# Patient Record
Sex: Female | Born: 1975 | Hispanic: No | Marital: Married | Smoking: Never smoker
Health system: Southern US, Community
[De-identification: ages and names within clinical notes are randomized; demographics above are authoritative.]

## PROBLEM LIST (undated history)

## (undated) DIAGNOSIS — D649 Anemia, unspecified: Secondary | ICD-10-CM

## (undated) DIAGNOSIS — Z90710 Acquired absence of both cervix and uterus: Secondary | ICD-10-CM

## (undated) HISTORY — PX: INGUINAL HERNIA REPAIR: SHX194

## (undated) HISTORY — PX: WISDOM TOOTH EXTRACTION: SHX21

---

## 2003-12-23 HISTORY — PX: UMBILICAL HERNIA REPAIR: SHX196

## 2018-09-24 NOTE — Patient Instructions (Signed)
Your procedure is scheduled on: Thursday, 10/24  Enter through the Main Entrance of [facility] at: 8 AM  Pick up the phone at the desk and dial [phone].  Call this number if you have problems the morning of surgery: [phone].  Remember: Do NOT eat food OR Do NOT drink clear liquids (including water) after midnight Wednesday.  Take these medicines the morning of surgery with a SIP OF WATER: None  Brush your teeth on the day of surgery.  Bring inhaler with you on day of surgery.  Do Not smoke on the day of surgery.  Stop herbal medications, vitamin supplements, Ibuprofen/NSAIDS at this time.  Do NOT wear jewelry (body piercing), metal hair clips/bobby pins, make-up, or nail polish. Do NOT wear lotions, powders, or perfumes.  You may wear deoderant. Do NOT shave for 48 hours prior to surgery. Do NOT bring valuables to the hospital. Contacts, dentures, or bridgework may not be worn into surgery.  Leave suitcase in car.  After surgery it may be brought to your room.  For patients admitted to the hospital, checkout time is 11:00 AM the day of discharge. Have a responsible adult drive you home and stay with you for 24 hours after your procedure.

## 2018-10-04 ENCOUNTER — Inpatient Hospital Stay (HOSPITAL_COMMUNITY)
Admission: RE | Admit: 2018-10-04 | Discharge: 2018-10-04 | Disposition: A | Discharge status: Home / Self Care | Payer: Self-pay | Source: Ambulatory Visit

## 2018-10-08 NOTE — Patient Instructions (Signed)
Your procedure is scheduled on: Thursday, 10/24 at 9:20 am  Enter through the Main Entrance of [facility] at:  7:45 am  Pick up the phone at the desk and dial [phone].  Call this number if you have problems the morning of surgery: [phone].  Remember: Do NOT eat food or Do NOT drink clear liquids (including water) after midnight Wednesday  Take these medicines the morning of surgery with a SIP OF WATER:   Brush your teeth on the day of surgery.  Bring inhaler with you on day of surgery.  Do Not smoke on the day of surgery.  Stop herbal medications, vitamin supplements, Ibuprofen/NSAIDS at this time.  Do NOT wear jewelry (body piercing), metal hair clips/bobby pins, make-up, or nail polish. Do NOT wear lotions, powders, or perfumes.  You may wear deoderant. Do NOT shave for 48 hours prior to surgery. Do NOT bring valuables to the hospital. Contacts, dentures, or bridgework may not be worn into surgery.  Leave suitcase in car.  After surgery it may be brought to your room.  For patients admitted to the hospital, checkout time is 11:00 AM the day of discharge.

## 2018-10-13 ENCOUNTER — Inpatient Hospital Stay (HOSPITAL_COMMUNITY): Admission: RE | Admit: 2018-10-13 | Payer: Managed Care, Other (non HMO) | Source: Ambulatory Visit

## 2018-10-13 ENCOUNTER — Encounter (HOSPITAL_COMMUNITY)
Admission: RE | Admit: 2018-10-13 | Discharge: 2018-10-13 | Disposition: A | Discharge status: Home / Self Care | Payer: Managed Care, Other (non HMO) | Source: Ambulatory Visit | Attending: Obstetrics and Gynecology | Admitting: Obstetrics and Gynecology

## 2018-10-13 ENCOUNTER — Other Ambulatory Visit: Payer: Self-pay

## 2018-10-13 ENCOUNTER — Encounter (HOSPITAL_COMMUNITY): Payer: Self-pay

## 2018-10-13 ENCOUNTER — Inpatient Hospital Stay (HOSPITAL_COMMUNITY)
Admission: RE | Admit: 2018-10-13 | Discharge: 2018-10-13 | Disposition: A | Discharge status: Home / Self Care | Payer: Managed Care, Other (non HMO) | Source: Ambulatory Visit

## 2018-10-13 HISTORY — DX: Anemia, unspecified: D64.9

## 2018-10-13 LAB — TYPE AND SCREEN
ABO/RH(D): O POS
ANTIBODY SCREEN: NEGATIVE

## 2018-10-13 LAB — CBC
HCT: 36.4 % (ref 36.0–46.0)
Hemoglobin: 12 g/dL (ref 12.0–15.0)
MCH: 28.6 pg (ref 26.0–34.0)
MCHC: 33 g/dL (ref 30.0–36.0)
MCV: 86.7 fL (ref 80.0–100.0)
Platelets: 415 10*3/uL — ABNORMAL HIGH (ref 150–400)
RBC: 4.2 MIL/uL (ref 3.87–5.11)
RDW: 14.1 % (ref 11.5–15.5)
WBC: 10.8 10*3/uL — AB (ref 4.0–10.5)
nRBC: 0 % (ref 0.0–0.2)

## 2018-10-13 LAB — ABO/RH: ABO/RH(D): O POS

## 2018-10-13 NOTE — Patient Instructions (Signed)
Your procedure is scheduled on: Thursday October 14, 2018 at 9:20 am  Enter through the Main Entrance of [facility] at: 7:45 am  Pick up the phone at the desk and dial [phone].  Call this number if you have problems the morning of surgery: [phone].  Remember: Do NOT eat food or drink any liquids after: Midnight on Wednesday October 23  Take these medicines the morning of surgery with a SIP OF WATER: none  STOP ALL HERBAL MEDICATIONS, SUPPLEMENTS, VITAMINS NOW  DO NOT SMOKE DAY OF SURGERY  BRUSH YOUR TEETH DAY OF SURGERY  Do NOT wear jewelry (body piercing), metal hair clips/bobby pins, make-up, or nail polish. Do NOT wear lotions, powders, or perfumes.  You may wear deoderant. Do NOT shave for 48 hours prior to surgery. Do NOT bring valuables to the hospital. Contacts, dentures, or bridgework may not be worn into surgery. Leave suitcase in car.  After surgery it may be brought to your room.    For patients admitted to the hospital, checkout time is 11:00 AM the day of discharge.

## 2018-10-13 NOTE — H&P (Signed)
Charlene Perkins is a 42yo [id number] female with menorrhagia, pelvic pain and dysmenorrhea over past year - worsening. She used to have 4-5 day cycles but now lasting 7-8 days, associated with cramping and passage of clots. She has to change tampons q 3 hours. Pt has had a BTL. She has completed family status. Pt noted to have an anterior fibroid.  She had a negative emb in august and was offered ablation vs hysterectomy for semi and definitive options. Risks and benefits of both procedures were reviewed including post op expectations. Pt opts for total laparoscopic hysterectomy with bilateral salpingectomy and cystoscopy; possible open.    Pertinent Gynecological History: Menses: flow is excessive with use of 8 pads or tampons on heaviest days Bleeding: menorrhagia Contraception: tubal ligation DES exposure: denies Blood transfusions: none Sexually transmitted diseases: no past history Previous GYN Procedures: btl  Last mammogram: normal Date: 10/2017 Last pap: normal Date: 11/2015 OB History: G5, P3023   Menstrual History: Menarche age: 58 No LMP recorded.    Past Medical History:  Diagnosis Date  . Anemia     Past Surgical History:  Procedure Laterality Date  . INGUINAL HERNIA REPAIR     AGE 59  . UMBILICAL HERNIA REPAIR  2005  . WISDOM TOOTH EXTRACTION      No family history on file.  Social History:  reports that she has never smoked. She has never used smokeless tobacco. She reports that she does not use drugs. Her alcohol history is not on file.  Allergies: No Known Allergies  No medications prior to admission.    Review of Systems  Constitutional: Positive for malaise/fatigue. Negative for chills, fever and weight loss.  Eyes: Negative for blurred vision and double vision.  Respiratory: Negative for shortness of breath.   Cardiovascular: Negative for chest pain and leg swelling.  Gastrointestinal: Positive for abdominal pain. Negative for heartburn, nausea and vomiting.   Genitourinary: Positive for urgency. Negative for dysuria.  Musculoskeletal: Positive for back pain.  Skin: Negative for itching and rash.  Neurological: Positive for dizziness. Negative for headaches.  Endo/Heme/Allergies: Does not bruise/bleed easily.  Psychiatric/Behavioral: Negative for depression, hallucinations, substance abuse and suicidal ideas. The patient is nervous/anxious.     There were no vitals taken for this visit. Physical Exam  Constitutional: She is oriented to person, place, and time. She appears well-developed and well-nourished.  Neck: Normal range of motion.  Cardiovascular: Normal rate.  Respiratory: Effort normal.  GI: Soft.  Genitourinary: Vagina normal and uterus normal.  Musculoskeletal: Normal range of motion.  Neurological: She is alert and oriented to person, place, and time.  Skin: Skin is warm.  Psychiatric: She has a normal mood and affect. Her behavior is normal. Judgment and thought content normal.    Results for orders placed or performed during the hospital encounter of 10/13/18 (from the past 24 hour(s))  CBC     Status: Abnormal   Collection Time: 10/13/18 11:40 AM  Result Value Ref Range   WBC 10.8 (H) 4.0 - 10.5 K/uL   RBC 4.20 3.87 - 5.11 MIL/uL   Hemoglobin 12.0 12.0 - 15.0 g/dL   HCT 36.4 36.0 - 46.0 %   MCV 86.7 80.0 - 100.0 fL   MCH 28.6 26.0 - 34.0 pg   MCHC 33.0 30.0 - 36.0 g/dL   RDW 14.1 11.5 - 15.5 %   Platelets 415 (H) 150 - 400 K/uL   nRBC 0.0 0.0 - 0.2 %  Type and screen Blanco  Dana     Status: None   Collection Time: 10/13/18 11:40 AM  Result Value Ref Range   ABO/RH(D) O POS    Antibody Screen NEG    Sample Expiration 10/27/2018    Extend sample reason      NO TRANSFUSIONS OR PREGNANCY IN THE PAST 3 MONTHS Performed at [facility], [address] [place], [place] [address]     No results found.  Assessment/Plan: 74AD [id number] female with menorrhagia, pelvic pain and dysmenorrhea and  fibroid opting for definative treatment via  total laparoscopic hysterectomy with bilateral salpingectomy and cystoscopy; possible open All questions answered and consent verified To OR when ready Prophylactic antibiotics to be given  Isaiah Serge 10/13/2018, 5:33 PM

## 2018-10-14 ENCOUNTER — Inpatient Hospital Stay (HOSPITAL_COMMUNITY): Payer: Managed Care, Other (non HMO) | Admitting: Anesthesiology

## 2018-10-14 ENCOUNTER — Inpatient Hospital Stay (HOSPITAL_COMMUNITY)
Admission: AD | Admit: 2018-10-14 | Discharge: 2018-10-15 | DRG: 743 | Disposition: A | Discharge status: Home / Self Care | Payer: Managed Care, Other (non HMO) | Attending: Obstetrics and Gynecology | Admitting: Obstetrics and Gynecology

## 2018-10-14 ENCOUNTER — Encounter (HOSPITAL_COMMUNITY)
Admission: AD | Disposition: A | Discharge status: Home / Self Care | Payer: Self-pay | Source: Home / Self Care | Attending: Obstetrics and Gynecology

## 2018-10-14 ENCOUNTER — Encounter (HOSPITAL_COMMUNITY): Payer: Self-pay

## 2018-10-14 ENCOUNTER — Other Ambulatory Visit: Payer: Self-pay

## 2018-10-14 DIAGNOSIS — Z9889 Other specified postprocedural states: Secondary | ICD-10-CM

## 2018-10-14 DIAGNOSIS — N946 Dysmenorrhea, unspecified: Secondary | ICD-10-CM | POA: Diagnosis present

## 2018-10-14 DIAGNOSIS — N945 Secondary dysmenorrhea: Secondary | ICD-10-CM | POA: Diagnosis present

## 2018-10-14 DIAGNOSIS — Z90710 Acquired absence of both cervix and uterus: Secondary | ICD-10-CM

## 2018-10-14 DIAGNOSIS — D259 Leiomyoma of uterus, unspecified: Secondary | ICD-10-CM | POA: Diagnosis present

## 2018-10-14 DIAGNOSIS — R102 Pelvic and perineal pain: Secondary | ICD-10-CM | POA: Diagnosis present

## 2018-10-14 DIAGNOSIS — N920 Excessive and frequent menstruation with regular cycle: Principal | ICD-10-CM | POA: Diagnosis present

## 2018-10-14 HISTORY — PX: TOTAL LAPAROSCOPIC HYSTERECTOMY WITH SALPINGECTOMY: SHX6742

## 2018-10-14 HISTORY — DX: Acquired absence of both cervix and uterus: Z90.710

## 2018-10-14 HISTORY — PX: CYSTOSCOPY: SHX5120

## 2018-10-14 LAB — PREGNANCY, URINE: PREG TEST UR: NEGATIVE

## 2018-10-14 SURGERY — HYSTERECTOMY, TOTAL, LAPAROSCOPIC, WITH SALPINGECTOMY
Anesthesia: General | Site: Bladder

## 2018-10-14 MED ORDER — PROPOFOL 10 MG/ML IV BOLUS
INTRAVENOUS | Status: AC
  Filled 2018-10-14: qty 20

## 2018-10-14 MED ORDER — OXYCODONE-ACETAMINOPHEN 5-325 MG PO TABS
1.0000 | ORAL_TABLET | ORAL | Status: DC | PRN
  Administered 2018-10-15: 1 via ORAL
  Filled 2018-10-14: qty 1

## 2018-10-14 MED ORDER — SUGAMMADEX SODIUM 200 MG/2ML IV SOLN
INTRAVENOUS | Status: DC | PRN
  Administered 2018-10-14: 222.2 mg via INTRAVENOUS

## 2018-10-14 MED ORDER — SODIUM CHLORIDE 0.9 % IR SOLN
Status: DC | PRN
  Administered 2018-10-14: 3000 mL

## 2018-10-14 MED ORDER — ROCURONIUM BROMIDE 100 MG/10ML IV SOLN
INTRAVENOUS | Status: AC
  Filled 2018-10-14: qty 1

## 2018-10-14 MED ORDER — LIDOCAINE HCL (CARDIAC) PF 100 MG/5ML IV SOSY
PREFILLED_SYRINGE | INTRAVENOUS | Status: AC
  Filled 2018-10-14: qty 5

## 2018-10-14 MED ORDER — LACTATED RINGERS IV SOLN
INTRAVENOUS | Status: DC
  Administered 2018-10-14: 125 mL/h via INTRAVENOUS
  Administered 2018-10-14 (×2): via INTRAVENOUS

## 2018-10-14 MED ORDER — KETOROLAC TROMETHAMINE 30 MG/ML IJ SOLN
INTRAMUSCULAR | Status: AC
  Filled 2018-10-14: qty 1

## 2018-10-14 MED ORDER — ROCURONIUM BROMIDE 100 MG/10ML IV SOLN
INTRAVENOUS | Status: DC | PRN
  Administered 2018-10-14: 10 mg via INTRAVENOUS
  Administered 2018-10-14: 50 mg via INTRAVENOUS
  Administered 2018-10-14: 5 mg via INTRAVENOUS

## 2018-10-14 MED ORDER — LIDOCAINE HCL (CARDIAC) PF 100 MG/5ML IV SOSY
PREFILLED_SYRINGE | INTRAVENOUS | Status: DC | PRN
  Administered 2018-10-14: 70 mg via INTRAVENOUS

## 2018-10-14 MED ORDER — MENTHOL 3 MG MT LOZG: 1.0000 | LOZENGE | OROMUCOSAL | Status: DC | PRN

## 2018-10-14 MED ORDER — BUPIVACAINE HCL (PF) 0.25 % IJ SOLN
INTRAMUSCULAR | Status: AC
  Filled 2018-10-14: qty 30

## 2018-10-14 MED ORDER — OXYCODONE-ACETAMINOPHEN 5-325 MG PO TABS
1.0000 | ORAL_TABLET | ORAL | Status: DC | PRN
  Administered 2018-10-14: 1 via ORAL
  Filled 2018-10-14: qty 1

## 2018-10-14 MED ORDER — FLUORESCEIN SODIUM 10 % IV SOLN
INTRAVENOUS | Status: AC
  Filled 2018-10-14: qty 5

## 2018-10-14 MED ORDER — ONDANSETRON HCL 4 MG PO TABS: 4.0000 mg | ORAL_TABLET | Freq: Four times a day (QID) | ORAL | Status: DC | PRN

## 2018-10-14 MED ORDER — DEXAMETHASONE SODIUM PHOSPHATE 4 MG/ML IJ SOLN
INTRAMUSCULAR | Status: AC
  Filled 2018-10-14: qty 1

## 2018-10-14 MED ORDER — SIMETHICONE 80 MG PO CHEW
80.0000 mg | CHEWABLE_TABLET | Freq: Four times a day (QID) | ORAL | Status: DC | PRN
  Administered 2018-10-15: 80 mg via ORAL
  Filled 2018-10-14: qty 1

## 2018-10-14 MED ORDER — IBUPROFEN 600 MG PO TABS
600.0000 mg | ORAL_TABLET | Freq: Four times a day (QID) | ORAL | Status: DC | PRN
  Administered 2018-10-14 – 2018-10-15 (×3): 600 mg via ORAL
  Filled 2018-10-14 (×4): qty 1

## 2018-10-14 MED ORDER — ONDANSETRON HCL 4 MG/2ML IJ SOLN
INTRAMUSCULAR | Status: AC
  Filled 2018-10-14: qty 2

## 2018-10-14 MED ORDER — FENTANYL CITRATE (PF) 250 MCG/5ML IJ SOLN
INTRAMUSCULAR | Status: AC
  Filled 2018-10-14: qty 5

## 2018-10-14 MED ORDER — GLYCOPYRROLATE 0.2 MG/ML IJ SOLN
INTRAMUSCULAR | Status: DC | PRN
  Administered 2018-10-14: 0.2 mg via INTRAVENOUS

## 2018-10-14 MED ORDER — DEXAMETHASONE SODIUM PHOSPHATE 10 MG/ML IJ SOLN
INTRAMUSCULAR | Status: DC | PRN
  Administered 2018-10-14: 10 mg via INTRAVENOUS

## 2018-10-14 MED ORDER — SCOPOLAMINE 1 MG/3DAYS TD PT72
MEDICATED_PATCH | TRANSDERMAL | Status: AC
  Filled 2018-10-14: qty 1

## 2018-10-14 MED ORDER — FLUORESCEIN SODIUM 10 % IV SOLN
INTRAVENOUS | Status: DC | PRN
  Administered 2018-10-14: 25 mg via INTRAVENOUS

## 2018-10-14 MED ORDER — ONDANSETRON HCL 4 MG/2ML IJ SOLN
INTRAMUSCULAR | Status: DC | PRN
  Administered 2018-10-14: 4 mg via INTRAVENOUS

## 2018-10-14 MED ORDER — BUPIVACAINE HCL (PF) 0.25 % IJ SOLN
INTRAMUSCULAR | Status: DC | PRN
  Administered 2018-10-14: 14 mL

## 2018-10-14 MED ORDER — SENNA 8.6 MG PO TABS
1.0000 | ORAL_TABLET | Freq: Two times a day (BID) | ORAL | Status: DC
  Administered 2018-10-14 – 2018-10-15 (×2): 8.6 mg via ORAL
  Filled 2018-10-14 (×5): qty 1

## 2018-10-14 MED ORDER — ONDANSETRON HCL 4 MG/2ML IJ SOLN
4.0000 mg | Freq: Four times a day (QID) | INTRAMUSCULAR | Status: DC | PRN
  Administered 2018-10-14: 4 mg via INTRAVENOUS
  Filled 2018-10-14: qty 2

## 2018-10-14 MED ORDER — FENTANYL CITRATE (PF) 100 MCG/2ML IJ SOLN
INTRAMUSCULAR | Status: DC | PRN
  Administered 2018-10-14: 100 ug via INTRAVENOUS
  Administered 2018-10-14 (×2): 50 ug via INTRAVENOUS
  Administered 2018-10-14: 100 ug via INTRAVENOUS
  Administered 2018-10-14 (×2): 50 ug via INTRAVENOUS
  Administered 2018-10-14: 100 ug via INTRAVENOUS

## 2018-10-14 MED ORDER — SCOPOLAMINE 1 MG/3DAYS TD PT72
1.0000 | MEDICATED_PATCH | Freq: Once | TRANSDERMAL | Status: DC
  Administered 2018-10-14: 1.5 mg via TRANSDERMAL

## 2018-10-14 MED ORDER — PANTOPRAZOLE SODIUM 40 MG PO TBEC
40.0000 mg | DELAYED_RELEASE_TABLET | Freq: Every day | ORAL | Status: DC
  Administered 2018-10-15: 40 mg via ORAL
  Filled 2018-10-14: qty 1

## 2018-10-14 MED ORDER — SUGAMMADEX SODIUM 200 MG/2ML IV SOLN
INTRAVENOUS | Status: AC
  Filled 2018-10-14: qty 4

## 2018-10-14 MED ORDER — DEXTROSE 5 % IV SOLN
3.0000 g | INTRAVENOUS | Status: DC
  Filled 2018-10-14: qty 3000

## 2018-10-14 MED ORDER — MIDAZOLAM HCL 2 MG/2ML IJ SOLN
INTRAMUSCULAR | Status: AC
  Filled 2018-10-14: qty 2

## 2018-10-14 MED ORDER — FENTANYL CITRATE (PF) 100 MCG/2ML IJ SOLN
INTRAMUSCULAR | Status: AC
  Administered 2018-10-14: 50 ug via INTRAVENOUS
  Filled 2018-10-14: qty 2

## 2018-10-14 MED ORDER — GLYCOPYRROLATE 0.2 MG/ML IJ SOLN
INTRAMUSCULAR | Status: AC
  Filled 2018-10-14: qty 3

## 2018-10-14 MED ORDER — PROPOFOL 10 MG/ML IV BOLUS
INTRAVENOUS | Status: DC | PRN
  Administered 2018-10-14: 150 mg via INTRAVENOUS

## 2018-10-14 MED ORDER — STERILE WATER FOR IRRIGATION IR SOLN
Status: DC | PRN
  Administered 2018-10-14: 1000 mL

## 2018-10-14 MED ORDER — LACTATED RINGERS IV SOLN: INTRAVENOUS | Status: DC

## 2018-10-14 MED ORDER — ALUM & MAG HYDROXIDE-SIMETH 200-200-20 MG/5ML PO SUSP: 30.0000 mL | ORAL | Status: DC | PRN

## 2018-10-14 MED ORDER — FENTANYL CITRATE (PF) 100 MCG/2ML IJ SOLN
25.0000 ug | INTRAMUSCULAR | Status: DC | PRN
  Administered 2018-10-14 (×2): 50 ug via INTRAVENOUS

## 2018-10-14 MED ORDER — CEFAZOLIN SODIUM-DEXTROSE 2-4 GM/100ML-% IV SOLN
INTRAVENOUS | Status: AC
  Filled 2018-10-14: qty 100

## 2018-10-14 MED ORDER — KETOROLAC TROMETHAMINE 30 MG/ML IJ SOLN
INTRAMUSCULAR | Status: DC | PRN
  Administered 2018-10-14: 30 mg via INTRAVENOUS

## 2018-10-14 MED ORDER — CEFAZOLIN SODIUM-DEXTROSE 2-3 GM-%(50ML) IV SOLR
INTRAVENOUS | Status: DC | PRN
  Administered 2018-10-14: 2 g via INTRAVENOUS

## 2018-10-14 MED ORDER — MIDAZOLAM HCL 2 MG/2ML IJ SOLN
INTRAMUSCULAR | Status: DC | PRN
  Administered 2018-10-14: 2 mg via INTRAVENOUS

## 2018-10-14 SURGICAL SUPPLY — 50 items
BARRIER ADHS 3X4 INTERCEED (GAUZE/BANDAGES/DRESSINGS) IMPLANT
CABLE HIGH FREQUENCY MONO STRZ (ELECTRODE) IMPLANT
COVER BACK TABLE 60X90IN (DRAPES) ×4 IMPLANT
COVER LIGHT HANDLE  1/PK (MISCELLANEOUS) ×4
COVER LIGHT HANDLE 1/PK (MISCELLANEOUS) ×4 IMPLANT
COVER MAYO STAND STRL (DRAPES) ×4 IMPLANT
DERMABOND ADVANCED (GAUZE/BANDAGES/DRESSINGS) ×2
DERMABOND ADVANCED .7 DNX12 (GAUZE/BANDAGES/DRESSINGS) ×2 IMPLANT
DEVICE SUTURE ENDOST 10MM (ENDOMECHANICALS) ×8 IMPLANT
DURAPREP 26ML APPLICATOR (WOUND CARE) ×4 IMPLANT
ELECT REM PT RETURN 9FT ADLT (ELECTROSURGICAL) ×4
ELECTRODE REM PT RTRN 9FT ADLT (ELECTROSURGICAL) ×2 IMPLANT
FILTER SMOKE EVAC LAPAROSHD (FILTER) ×4 IMPLANT
GLOVE BIO SURGEON STRL SZ 6.5 (GLOVE) ×6 IMPLANT
GLOVE BIO SURGEONS STRL SZ 6.5 (GLOVE) ×2
GLOVE BIOGEL PI IND STRL 7.0 (GLOVE) ×6 IMPLANT
GLOVE BIOGEL PI INDICATOR 7.0 (GLOVE) ×6
GOWN STRL REUS W/TWL LRG LVL3 (GOWN DISPOSABLE) ×12 IMPLANT
MANIPULATOR UTERINE 7CM CLEARV (MISCELLANEOUS) IMPLANT
NS IRRIG 1000ML POUR BTL (IV SOLUTION) ×4 IMPLANT
OCCLUDER COLPOPNEUMO (BALLOONS) ×4 IMPLANT
PACK LAVH (CUSTOM PROCEDURE TRAY) ×4 IMPLANT
PACK TRENDGUARD 600 HYBRD PROC (MISCELLANEOUS) ×2 IMPLANT
PORT ACCESS TROCAR AIRSEAL 12 (TROCAR) ×2 IMPLANT
PORT ACCESS TROCAR AIRSEAL 5M (TROCAR) ×2
PROTECTOR NERVE ULNAR (MISCELLANEOUS) ×8 IMPLANT
SCISSORS LAP 5X35 DISP (ENDOMECHANICALS) IMPLANT
SET CYSTO W/LG BORE CLAMP LF (SET/KITS/TRAYS/PACK) ×4 IMPLANT
SET IRRIG TUBING LAPAROSCOPIC (IRRIGATION / IRRIGATOR) ×4 IMPLANT
SET TRI-LUMEN FLTR TB AIRSEAL (TUBING) ×4 IMPLANT
SHEARS HARMONIC ACE PLUS 36CM (ENDOMECHANICALS) ×4 IMPLANT
SLEEVE XCEL OPT CAN 5 100 (ENDOMECHANICALS) ×4 IMPLANT
SUT ENDO VLOC 180-0-8IN (SUTURE) ×8 IMPLANT
SUT VIC AB 0 CT1 27 (SUTURE) ×4
SUT VIC AB 0 CT1 27XBRD ANBCTR (SUTURE) ×4 IMPLANT
SUT VICRYL 0 UR6 27IN ABS (SUTURE) ×4 IMPLANT
SUT VICRYL 4-0 PS2 18IN ABS (SUTURE) ×4 IMPLANT
SYR 10ML LL (SYRINGE) ×4 IMPLANT
SYR 50ML LL SCALE MARK (SYRINGE) ×4 IMPLANT
SYSTEM CARTER THOMASON II (TROCAR) ×4 IMPLANT
TIP UTERINE 6.7X8CM BLUE DISP (MISCELLANEOUS) ×4 IMPLANT
TOWEL OR 17X24 6PK STRL BLUE (TOWEL DISPOSABLE) ×8 IMPLANT
TRAY FOLEY W/BAG SLVR 14FR (SET/KITS/TRAYS/PACK) ×4 IMPLANT
TRENDGUARD 600 HYBRID PROC PK (MISCELLANEOUS) ×4
TROCAR PORT AIRSEAL 5X120 (TROCAR) IMPLANT
TROCAR PORT AIRSEAL 8X120 (TROCAR) IMPLANT
TROCAR XCEL NON-BLD 11X100MML (ENDOMECHANICALS) ×4 IMPLANT
TROCAR XCEL NON-BLD 5MMX100MML (ENDOMECHANICALS) ×4 IMPLANT
TUBING INSUF HEATED (TUBING) ×4 IMPLANT
WARMER LAPAROSCOPE (MISCELLANEOUS) ×4 IMPLANT

## 2018-10-14 NOTE — Anesthesia Procedure Notes (Addendum)
Procedure Name: Intubation Date/Time: 10/14/2018 9:29 AM Performed by: Rayvon Char, CRNA Pre-anesthesia Checklist: Patient identified, Emergency Drugs available and Suction available Patient Re-evaluated:Patient Re-evaluated prior to induction Oxygen Delivery Method: Circle system utilized Preoxygenation: Pre-oxygenation with 100% oxygen Induction Type: IV induction Ventilation: Mask ventilation without difficulty Laryngoscope Size: Miller and 2 Grade View: Grade II Tube size: 7.0 mm Number of attempts: 1 Airway Equipment and Method: Stylet

## 2018-10-14 NOTE — Progress Notes (Signed)
Patient ID: Charlene Perkins, female   DOB: 1976-08-13, 42 y.o.   MRN: [id number] POD#0  Pt doing well. Some pain - mild. No fever or chills. Got lightheaded going to bathroom. Better now. Pain well controlled  VSS  Plan: routine post op care Pain meds now

## 2018-10-14 NOTE — Brief Op Note (Signed)
10/14/2018  12:10 PM  PATIENT:  Charlene Perkins  42 y.o. female  PRE-OPERATIVE DIAGNOSIS:  menorrhagia, dysmenorrhea, uterine leiomyoma  POST-OPERATIVE DIAGNOSIS:  menorrhagia, dysmenorrhea, uterine leiomyoma  PROCEDURE:  Procedure(s) with comments: TOTAL LAPAROSCOPIC HYSTERECTOMY WITH SALPINGECTOMY (Bilateral) - poss open CYSTOSCOPY (N/A)  SURGEON:  Surgeon(s) and Role:    * Jontez Redfield, Bonnee Quin, DO - Primary    * Meisinger, Todd, MD - Assisting  PHYSICIAN ASSISTANT:   ASSISTANTS: Dr Willis Modena   ANESTHESIA:   general  EBL:  300 mL   BLOOD ADMINISTERED:none  DRAINS: none   LOCAL MEDICATIONS USED:  MARCAINE     SPECIMEN:  Source of Specimen:  uterus, cervix and bilateral tubes  DISPOSITION OF SPECIMEN:  PATHOLOGY  COUNTS:  YES  TOURNIQUET:  * No tourniquets in log *  DICTATION: .Note written in EPIC  PLAN OF CARE: Admit for overnight observation  PATIENT DISPOSITION:  PACU - hemodynamically stable.   Delay start of Pharmacological VTE agent (>24hrs) due to surgical blood loss or risk of bleeding: yes

## 2018-10-14 NOTE — Anesthesia Postprocedure Evaluation (Signed)
Anesthesia Post Note  Patient: Tessy Pawelski  Procedure(s) Performed: TOTAL LAPAROSCOPIC HYSTERECTOMY WITH SALPINGECTOMY (Bilateral Abdomen) CYSTOSCOPY (N/A Bladder)     Patient location during evaluation: Mother Baby Anesthesia Type: General Level of consciousness: awake and alert and oriented Pain management: satisfactory to patient Vital Signs Assessment: post-procedure vital signs reviewed and stable Respiratory status: respiratory function stable and spontaneous breathing Cardiovascular status: blood pressure returned to baseline Postop Assessment: no headache, no backache, spinal receding, patient able to bend at knees and adequate PO intake Anesthetic complications: no    Last Vitals:  Vitals:   10/14/18 1345 10/14/18 1409  BP: 112/71 (!) 108/59  Pulse: 89 68  Resp: 13 17  Temp: (!) 36.4 C 36.8 C  SpO2: 91% 100%    Last Pain:  Vitals:   10/14/18 1415  TempSrc:   PainSc: 3    Pain Goal: Patients Stated Pain Goal: 3 (10/14/18 1415)               Aayushi Solorzano

## 2018-10-14 NOTE — Anesthesia Postprocedure Evaluation (Signed)
Anesthesia Post Note  Patient: Charlene Perkins  Procedure(s) Performed: TOTAL LAPAROSCOPIC HYSTERECTOMY WITH SALPINGECTOMY (Bilateral Abdomen) CYSTOSCOPY (N/A Bladder)     Patient location during evaluation: PACU Anesthesia Type: General Level of consciousness: awake and alert Pain management: pain level controlled Vital Signs Assessment: post-procedure vital signs reviewed and stable Respiratory status: spontaneous breathing, nonlabored ventilation, respiratory function stable and patient connected to nasal cannula oxygen Cardiovascular status: blood pressure returned to baseline and stable Postop Assessment: no apparent nausea or vomiting Anesthetic complications: no    Last Vitals:  Vitals:   10/14/18 1345 10/14/18 1409  BP: 112/71 (!) 108/59  Pulse: 89 68  Resp: 13 17  Temp: (!) 36.4 C 36.8 C  SpO2: 91% 100%    Last Pain:  Vitals:   10/14/18 1409  TempSrc: Oral  PainSc:    Pain Goal: Patients Stated Pain Goal: 3 (10/14/18 0818)               Axtyn Woehler L Christoph Copelan

## 2018-10-14 NOTE — Addendum Note (Signed)
Addendum  created 10/14/18 1438 by Flossie Dibble, CRNA   Sign clinical note

## 2018-10-14 NOTE — Transfer of Care (Signed)
Immediate Anesthesia Transfer of Care Note  Patient: Charlene Perkins  Procedure(s) Performed: TOTAL LAPAROSCOPIC HYSTERECTOMY WITH SALPINGECTOMY (Bilateral Abdomen) CYSTOSCOPY (N/A Bladder)  Patient Location: PACU  Anesthesia Type:General  Level of Consciousness: awake, alert  and oriented  Airway & Oxygen Therapy: Patient Spontanous Breathing and Patient connected to nasal cannula oxygen  Post-op Assessment: Report given to RN and Post -op Vital signs reviewed and stable  Post vital signs: Reviewed and stable  Last Vitals:  Vitals Value Taken Time  BP 104/63 10/14/2018 12:21 PM  Temp    Pulse 86 10/14/2018 12:22 PM  Resp 16 10/14/2018 12:22 PM  SpO2 100 % 10/14/2018 12:22 PM  Vitals shown include unvalidated device data.  Last Pain:  Vitals:   10/14/18 0818  TempSrc: Oral  PainSc: 2       Patients Stated Pain Goal: 3 (55/21/74 7159)  Complications: No apparent anesthesia complications

## 2018-10-14 NOTE — Interval H&P Note (Signed)
History and Physical Interval Note: No changes Procedure re-reviewed and consent verified To OR when ready  10/14/2018 9:14 AM  Charlene Perkins  has presented today for surgery, with the diagnosis of menorrhagia, dysmenorrhea, uterine leiomyoma  The various methods of treatment have been discussed with the patient and family. After consideration of risks, benefits and other options for treatment, the patient has consented to  Procedure(s) with comments: TOTAL LAPAROSCOPIC HYSTERECTOMY WITH SALPINGECTOMY (Bilateral) - poss open CYSTOSCOPY (N/A) as a surgical intervention .  The patient's history has been reviewed, patient examined, no change in status, stable for surgery.  I have reviewed the patient's chart and labs.  Questions were answered to the patient's satisfaction.     Isaiah Serge

## 2018-10-14 NOTE — Anesthesia Preprocedure Evaluation (Addendum)
Anesthesia Evaluation  Patient identified by MRN, date of birth, ID band Patient awake    Reviewed: Allergy & Precautions, NPO status , Patient's Chart, lab work & pertinent test results  Airway Mallampati: I  TM Distance: >3 FB Neck ROM: Full    Dental no notable dental hx. (+) Teeth Intact, Dental Advisory Given   Pulmonary neg pulmonary ROS,    Pulmonary exam normal breath sounds clear to auscultation       Cardiovascular negative cardio ROS Normal cardiovascular exam Rhythm:Regular Rate:Normal     Neuro/Psych negative neurological ROS  negative psych ROS   GI/Hepatic negative GI ROS, Neg liver ROS,   Endo/Other  negative endocrine ROS  Renal/GU negative Renal ROS  negative genitourinary   Musculoskeletal negative musculoskeletal ROS (+)   Abdominal   Peds  Hematology  (+) anemia ,   Anesthesia Other Findings Uterine fibroids, menorrhagia  Reproductive/Obstetrics                            Anesthesia Physical Anesthesia Plan  ASA: II  Anesthesia Plan: General   Post-op Pain Management:    Induction: Intravenous  PONV Risk Score and Plan: 3 and Dexamethasone, Ondansetron, Scopolamine patch - Pre-op and Midazolam  Airway Management Planned: Oral ETT  Additional Equipment:   Intra-op Plan:   Post-operative Plan: Extubation in OR  Informed Consent: I have reviewed the patients History and Physical, chart, labs and discussed the procedure including the risks, benefits and alternatives for the proposed anesthesia with the patient or authorized representative who has indicated his/her understanding and acceptance.   Dental advisory given  Plan Discussed with: CRNA  Anesthesia Plan Comments:         Anesthesia Quick Evaluation

## 2018-10-15 ENCOUNTER — Encounter (HOSPITAL_COMMUNITY): Payer: Self-pay | Admitting: Obstetrics and Gynecology

## 2018-10-15 DIAGNOSIS — D259 Leiomyoma of uterus, unspecified: Secondary | ICD-10-CM | POA: Diagnosis present

## 2018-10-15 DIAGNOSIS — N946 Dysmenorrhea, unspecified: Secondary | ICD-10-CM | POA: Diagnosis present

## 2018-10-15 DIAGNOSIS — R102 Pelvic and perineal pain: Secondary | ICD-10-CM | POA: Diagnosis present

## 2018-10-15 DIAGNOSIS — N920 Excessive and frequent menstruation with regular cycle: Secondary | ICD-10-CM | POA: Diagnosis present

## 2018-10-15 LAB — CBC
HEMATOCRIT: 29.2 % — AB (ref 36.0–46.0)
HEMOGLOBIN: 9.9 g/dL — AB (ref 12.0–15.0)
MCH: 28.8 pg (ref 26.0–34.0)
MCHC: 33.9 g/dL (ref 30.0–36.0)
MCV: 84.9 fL (ref 80.0–100.0)
PLATELETS: 345 10*3/uL (ref 150–400)
RBC: 3.44 MIL/uL — ABNORMAL LOW (ref 3.87–5.11)
RDW: 14.2 % (ref 11.5–15.5)
WBC: 15.7 10*3/uL — ABNORMAL HIGH (ref 4.0–10.5)
nRBC: 0 % (ref 0.0–0.2)

## 2018-10-15 MED ORDER — IBUPROFEN 600 MG PO TABS: 600.0000 mg | ORAL_TABLET | Freq: Four times a day (QID) | ORAL | 1 refills | Status: DC | PRN

## 2018-10-15 MED ORDER — SIMETHICONE 80 MG PO CHEW: 80.0000 mg | CHEWABLE_TABLET | Freq: Four times a day (QID) | ORAL | 0 refills | Status: DC | PRN

## 2018-10-15 MED ORDER — OXYCODONE-ACETAMINOPHEN 5-325 MG PO TABS: 1.0000 | ORAL_TABLET | Freq: Four times a day (QID) | ORAL | 0 refills | Status: AC | PRN

## 2018-10-15 NOTE — Op Note (Signed)
Operative Note    Preoperative Diagnosis: menorrhgia, dysmenorrhea and uterine leiomyoma   Postoperative Diagnosis: same   Procedure: Total laparoscopic hysterectomy with bilateral salpingectomy  Surgeon: Mickle Mallory DO Assist: Meisinger T MD  Anesthesia: General  Fluids: LR 2L EBL: 312ml UOP: 67ml   Findings: boggy uterus measuring 10cm, normal bilateral tubes showing previous ligation; normal ovaries   Specimen: uterus, bilateral fallopian tube remnants, cervix   Procedure Note Pt seen in pre-op. Procedure reviewed and all questions answered; consent verified  Pt taken to operating room and placed in dorsal lithotomy position with her arms safely positioned at her sides. General anesthesia was administered and found to be adequate. Pt was prepped and draped in sterile fashion and a timeout performed. A weighted speculum was placed in the posterior fornix and a sim retractor placed anteriorly.Great visualization of cervix noted; high. Uterus was sounded to 10cm so a size 8 koh was assembled with a small cup and placed with retention sutures at 3 and 9 o'clock. A foley catheter was also placed in a sterile fashion Legs were then lowered and attention turned to her abdomen.  Here a 48mm incision was made at the umbilicus. A 74mm optiview trocar was then placed with the abdomen tented upwards. The laparoscopic camera was used to confirm placement and pneumoperitoneum obtained with CO2 gas to 28mmHg. The patient was placed in trendelenberg and gross survey of pelvis done.The uterus was noted as described above:  At this time one more 84mm port was then placed under direct visualization in right lower quadrant and an 12 site in the left with care taken to avoid the epigastric vessels.  Further exploration noted   Starting on the the patients left, the left fallopian tube remnant was then grasped with a blunt grasper, elevated and excised using the harmonic hemostatically. Next the  utero-ovarian ligament and the round ligaments were sequentially grasped and excised. The broad ligament was then separated from the uterus as well with a bladder flap created. Several bleeding vessels were effective coagulated. The cardinal ligament was then excised next at the utero-cervical junction and the ovarian vessel clamped, cauterized and cut. Again bleeding vessels were cauterized effectively. The same was done on the patients right with similar results.  Next the bladder reflection was dissected away. The vaginal occluder had been filled with 60cc of saline and starting anteriorly and working laterally the uterus and cervix were amputated off the superior vagina. The uterus, cervix and fallopian tubes were removed vaginally.  The pelvis was irrigated and hemostasis noted. The angles of the vaginal cuff were easily seen and using the endostitch with an 0 vicryl v-lock suture, the cuff was closed in a running fashion with 3-4 back stitches to ensure closure.  Further irrigation of the pelvis confirmed no abnormalities or bleeding hence patient was flattened.  The 12 port site was closed with 0-vicryl suture with a carter thomasen to ensure closure of the peritoneum.  The remaining  trocars were removed under direct visualization and some gas allowed to escape.  Incision sites were closed with 4-0 vicryl suture and dermabond.   A cystoscopy was performed after pt given flourescein. Jets were noted from both ureteral orifices. Counts were noted to be correct. Patient was awakened and taken to recovery room in stable status.  Foley was left in place

## 2018-10-15 NOTE — Discharge Instructions (Signed)
Nothing in vagina and no heavy straining for 6 weeks.  No sex, tampons, and douching.  Other instructions as in [employer].

## 2018-10-15 NOTE — Discharge Summary (Signed)
Physician Discharge Summary  Patient ID: Charlene Perkins MRN: [id number] DOB/AGE: [birth year] 42 y.o.  Admit date: 10/14/2018 Discharge date: 10/15/2018  Admission Diagnoses:  Discharge Diagnoses:  Active Problems:   Secondary dysmenorrhea   S/P laparoscopic hysterectomy   Post-operative state   Discharged Condition: stable  Hospital Course: Pt admitted overnight for extended recovery. Did well. Stable to dsicharge to home today  Consults: None  Significant Diagnostic Studies: labs: wnl  Treatments: IV hydration and analgesia: ibuprofen and oxycodone  Discharge Exam: Blood pressure 128/81, pulse 68, temperature 98.7 F (37.1 C), temperature source Oral, resp. rate 18, height 5\' 4"  (1.626 m), weight 111.1 kg, SpO2 99 %. General appearance: alert, cooperative and no distress Extremities: Homans sign is negative, no sign of DVT Incision/Wound:well approx  Disposition: Discharge disposition: 01-Home or Self Care       Discharge Instructions    Call MD for:  persistant nausea and vomiting   Complete by:  As directed    Call MD for:  redness, tenderness, or signs of infection (pain, swelling, redness, odor or green/yellow discharge around incision site)   Complete by:  As directed    Call MD for:  severe uncontrolled pain   Complete by:  As directed    Call MD for:  temperature >100.4   Complete by:  As directed    Diet - low sodium heart healthy   Complete by:  As directed    Discharge instructions   Complete by:  As directed    No heavy straining and nothing in vagina x 6 weeks   Increase activity slowly   Complete by:  As directed      Allergies as of 10/15/2018   No Known Allergies     Medication List    TAKE these medications   ibuprofen 600 MG tablet Commonly known as:  ADVIL,MOTRIN Take 1 tablet (600 mg total) by mouth every 6 (six) hours as needed for moderate pain or cramping (mild pain).   oxyCODONE-acetaminophen 5-325 MG tablet Commonly known as:   PERCOCET/ROXICET Take 1-2 tablets by mouth every 6 (six) hours as needed for up to 7 days for moderate pain ((when tolerating fluids)).   simethicone 80 MG chewable tablet Commonly known as:  MYLICON Chew 1 tablet (80 mg total) by mouth every 6 (six) hours as needed for flatulence.      Follow-up Information    Sherlyn Hay, DO. Schedule an appointment as soon as possible for a visit in 2 week(s).   Specialty:  Obstetrics and Gynecology Why:  For incision check and 6 weeks for post op visit Contact information: [address] [place] [address] [phone]           Signed: Isaiah Serge 10/15/2018, 12:54 PM

## 2018-10-15 NOTE — Progress Notes (Signed)
Patient ID: Charlene Perkins, female   DOB: [birth year], 42 y.o.   MRN: [id number] Pt feeling better. Gas pain improving. Passing flatus. Feels ready for discharge to home VSS ABD - soft EXT - no homans  15.7>9.9<345  A/P: POD#1 s//p TLH/BS - stable         Discharge to home          F/u in office in 2 weeks for incision check

## 2018-10-15 NOTE — Brief Op Note (Signed)
10/14/2018  12:56 PM  PATIENT:  Charlene Perkins  42 y.o. female  PRE-OPERATIVE DIAGNOSIS:  menorrhagia, dysmenorrhea, uterine leiomyoma  POST-OPERATIVE DIAGNOSIS:  menorrhagia, dysmenorrhea, uterine leiomyoma  PROCEDURE:  Procedure(s) with comments: TOTAL LAPAROSCOPIC HYSTERECTOMY WITH SALPINGECTOMY (Bilateral) - poss open CYSTOSCOPY (N/A)  SURGEON:  Surgeon(s) and Role:    * Banga, Bonnee Quin, DO - Primary    * Meisinger, Todd, MD - Assisting    ANESTHESIA:   general  EBL:  300 mL   BLOOD ADMINISTERED:none  DRAINS: none   LOCAL MEDICATIONS USED:  MARCAINE     SPECIMEN:  Source of Specimen:  uterus, tubes and cervix  DISPOSITION OF SPECIMEN:  PATHOLOGY  COUNTS:  YES  TOURNIQUET:  * No tourniquets in log *  DICTATION: .Note written in EPIC  PLAN OF CARE: Admit for overnight observation  PATIENT DISPOSITION:  PACU - hemodynamically stable.   Delay start of Pharmacological VTE agent (>24hrs) due to surgical blood loss or risk of bleeding: yes

## 2018-10-15 NOTE — Progress Notes (Signed)
1 Day Post-Op Procedure(s) (LRB): TOTAL LAPAROSCOPIC HYSTERECTOMY WITH SALPINGECTOMY (Bilateral) CYSTOSCOPY (N/A)  Subjective: Patient reports tolerating PO, + flatus and no problems voiding. Incision pain tolerable with ibuprofen but has left shoulder pain. She ia ambulating well  Objective: I have reviewed patient's vital signs, intake and output, medications and labs.  General: alert, cooperative and no distress Extremities: edema none  Assessment: s/p Procedure(s) with comments: TOTAL LAPAROSCOPIC HYSTERECTOMY WITH SALPINGECTOMY (Bilateral) - poss open CYSTOSCOPY (N/A): stable  Plan: Encourage ambulation gas relief - medication to be given now  Discharge to home this afternoon   LOS: 1 day    [place]s 10/15/2018, 8:59 AM

## 2019-04-22 ENCOUNTER — Other Ambulatory Visit: Payer: Self-pay

## 2019-04-22 DIAGNOSIS — R0602 Shortness of breath: Secondary | ICD-10-CM | POA: Diagnosis present

## 2019-04-22 NOTE — ED Triage Notes (Addendum)
Pt from home state slight SOB late this afternoon went home was ok but once in bed felt as thought she was gasping for air and her throat was progressively tightening Currently no distress noted, no stridor, lungs essentially clear all fields. Pt states it feel like she has a lot of phlegm but cough is non-productive.

## 2019-04-23 ENCOUNTER — Emergency Department (HOSPITAL_COMMUNITY)
Admission: EM | Admit: 2019-04-23 | Discharge: 2019-04-23 | Disposition: A | Discharge status: Home / Self Care | Payer: Managed Care, Other (non HMO) | Attending: Emergency Medicine | Admitting: Emergency Medicine

## 2019-04-23 ENCOUNTER — Emergency Department (HOSPITAL_COMMUNITY): Payer: Managed Care, Other (non HMO)

## 2019-04-23 DIAGNOSIS — R0602 Shortness of breath: Secondary | ICD-10-CM

## 2019-04-23 MED ORDER — DEXAMETHASONE 4 MG PO TABS
10.0000 mg | ORAL_TABLET | Freq: Once | ORAL | Status: AC
  Administered 2019-04-23: 10 mg via ORAL
  Filled 2019-04-23: qty 2

## 2019-04-23 MED ORDER — LIDOCAINE HCL (PF) 1 % IJ SOLN: 20.0000 mL | Freq: Once | INTRAMUSCULAR | Status: DC

## 2019-04-23 NOTE — ED Provider Notes (Signed)
[facility] DEPT Provider Note   CSN: [id number] Arrival date & time: 04/22/19  2254    History   Chief Complaint Chief Complaint  Patient presents with  . Shortness of Breath  . Throat tightening    HPI Charlene Perkins is a 43 y.o. female.   The history is provided by the patient.  She has history of anemia and comes in with shortness of breath and sounds like there is mucus in her throat.  She started having some mild scratchy throat about 2 days ago and has had an intermittent mild cough which has been nonproductive.  She states it felt like there might be something in her throat but it seemed to get worse today.  Today, she noted that her heart was beating fast.  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  She denies fever, chills, sweats.  She denies arthralgias or myalgias.  She denies nausea, vomiting, diarrhea.  She denies any sick contacts and specifically denies exposure to anyone with known COVID-19.  There is been no recent travel or contact with people with recent travel.  Past Medical History:  Diagnosis Date  . Anemia   . S/P laparoscopic hysterectomy 10/14/2018    Patient Active Problem List   Diagnosis Date Noted  . 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 S/P laparoscopic hysterectomy 10/14/2018  . Post-operative state 10/14/2018    Past Surgical History:  Procedure Laterality Date  . CYSTOSCOPY N/A 10/14/2018   Procedure: CYSTOSCOPY;  Surgeon: Sherlyn Hay, DO;  Location: [facility];  Service: Gynecology;  Laterality: N/A;  . INGUINAL HERNIA REPAIR     AGE 15  . TOTAL LAPAROSCOPIC HYSTERECTOMY WITH SALPINGECTOMY Bilateral 10/14/2018   Procedure: TOTAL LAPAROSCOPIC HYSTERECTOMY WITH SALPINGECTOMY;  Surgeon: Sherlyn Hay, DO;  Location: [facility];  Service: Gynecology;  Laterality: Bilateral;  poss open  . UMBILICAL HERNIA REPAIR  2005  . WISDOM TOOTH  EXTRACTION       OB History   No obstetric history on file.      Home Medications    Prior to Admission medications   Medication Sig Start Date End Date Taking? Authorizing Provider  ibuprofen (ADVIL,MOTRIN) 600 MG tablet Take 1 tablet (600 mg total) by mouth every 6 (six) hours as needed for moderate pain or cramping (mild pain). 10/15/18   Sherlyn Hay, DO  simethicone (MYLICON) 80 MG chewable tablet Chew 1 tablet (80 mg total) by mouth every 6 (six) hours as needed for flatulence. 10/15/18   Sherlyn Hay, DO    Family History No family history on file.  Social History Social History   Tobacco Use  . Smoking status: Never Smoker  . Smokeless tobacco: Never Used  Substance Use Topics  . Alcohol use: Not on file    Comment: SOCIALLY I PER YEAR  . Drug use: Never     Allergies   Patient has no known allergies.   Review of Systems Review of Systems  All other systems reviewed and are negative.    Physical Exam Updated Vital Signs BP (!) 143/97 (BP Location: Left Arm)   Pulse (!) 115   Temp 98.7 F (37.1 C) (Oral)   Resp 18   SpO2 100%   Physical Exam Vitals signs and nursing note reviewed.    43 year old female, resting comfortably and in no acute distress. Vital signs are significant for mildly elevated blood pressure and  elevated heart rate. Oxygen saturation is 100%, which is normal. Head is normocephalic and atraumatic. PERRLA, EOMI. Oropharynx is clear.  There is minimal edema of the uvula.  There is no stridor and there is no pooling of secretions.  Phonation is normal. Neck is nontender and supple without adenopathy or JVD. Back is nontender and there is no CVA tenderness. Lungs are clear without rales, wheezes, or rhonchi. Chest is nontender. Heart has regular rate and rhythm without murmur. Abdomen is soft, flat, nontender without masses or hepatosplenomegaly and peristalsis is normoactive. Extremities have no cyanosis or edema,  full range of motion is present. Skin is warm and dry without rash. Neurologic: Mental status is normal, cranial nerves are intact, there are no motor or sensory deficits.  ED Treatments / Results   Radiology Dg Neck Soft Tissue  Result Date: 04/23/2019 CLINICAL DATA:  Shortness of breath EXAM: NECK SOFT TISSUES - 1+ VIEW COMPARISON:  None. FINDINGS: There is no evidence of retropharyngeal soft tissue swelling or epiglottic enlargement. The cervical airway is unremarkable and no radio-opaque foreign body identified. IMPRESSION: Negative. Electronically Signed   By: Rolm Baptise M.D.   On: 04/23/2019 01:19   Dg Chest 2 View  Result Date: 04/23/2019 CLINICAL DATA:  Shortness of breath EXAM: CHEST - 2 VIEW COMPARISON:  None. FINDINGS: Heart and mediastinal contours are within normal limits. No focal opacities or effusions. No acute bony abnormality. IMPRESSION: No active cardiopulmonary disease. Electronically Signed   By: Rolm Baptise M.D.   On: 04/23/2019 01:19    Procedures Procedures   Medications Ordered in ED Medications  dexamethasone (DECADRON) tablet 10 mg (10 mg Oral Given 04/23/19 0135)     Initial Impression / Assessment and Plan / ED Course  I have reviewed the triage vital signs and the nursing notes.  Pertinent imaging results that were available during my care of the patient were reviewed by me and considered in my medical decision making (see chart for details).  Sense of mucus accumulating in her throat which may be related to the edema of her uvula.  She is afebrile and with normal oxygen saturation.  Low index of suspicion for COVID-19.  Will check chest x-ray and soft tissue neck x-ray.  Old records are reviewed, and she has no relevant past visits.  Chest x-ray and soft tissue neck x-rays are unremarkable.  Patient is advised of these findings.  She is given a single dose of dexamethasone which should help with any edema in the pharynx.  Advised to return if symptoms  are worsening.  Final Clinical Impressions(s) / ED Diagnoses   Final diagnoses:  SOB (shortness of breath)    ED Discharge Orders    None       Delora Fuel, MD 36/64/40 [phone]

## 2019-04-23 NOTE — Discharge Instructions (Addendum)
Drink plenty of fluids.  Return if symptoms are getting worse. 

## 2019-10-07 ENCOUNTER — Emergency Department (HOSPITAL_COMMUNITY)
Admission: EM | Admit: 2019-10-07 | Discharge: 2019-10-08 | Disposition: A | Discharge status: Home / Self Care | Payer: Managed Care, Other (non HMO) | Attending: Emergency Medicine | Admitting: Emergency Medicine

## 2019-10-07 ENCOUNTER — Encounter (HOSPITAL_COMMUNITY): Payer: Self-pay | Admitting: Emergency Medicine

## 2019-10-07 ENCOUNTER — Other Ambulatory Visit: Payer: Self-pay

## 2019-10-07 DIAGNOSIS — U071 COVID-19: Secondary | ICD-10-CM | POA: Diagnosis not present

## 2019-10-07 DIAGNOSIS — R079 Chest pain, unspecified: Secondary | ICD-10-CM

## 2019-10-07 MED ORDER — SODIUM CHLORIDE 0.9% FLUSH
3.0000 mL | Freq: Once | INTRAVENOUS | Status: AC
  Administered 2019-10-08: 3 mL via INTRAVENOUS

## 2019-10-08 ENCOUNTER — Other Ambulatory Visit: Payer: Self-pay

## 2019-10-08 ENCOUNTER — Emergency Department (HOSPITAL_COMMUNITY): Payer: Managed Care, Other (non HMO)

## 2019-10-08 LAB — CBC
HCT: 39 % (ref 36.0–46.0)
Hemoglobin: 12.6 g/dL (ref 12.0–15.0)
MCH: 28.6 pg (ref 26.0–34.0)
MCHC: 32.3 g/dL (ref 30.0–36.0)
MCV: 88.6 fL (ref 80.0–100.0)
Platelets: 376 10*3/uL (ref 150–400)
RBC: 4.4 MIL/uL (ref 3.87–5.11)
RDW: 13.3 % (ref 11.5–15.5)
WBC: 14.7 10*3/uL — ABNORMAL HIGH (ref 4.0–10.5)
nRBC: 0 % (ref 0.0–0.2)

## 2019-10-08 LAB — BASIC METABOLIC PANEL
Anion gap: 9 (ref 5–15)
BUN: 18 mg/dL (ref 6–20)
CO2: 24 mmol/L (ref 22–32)
Calcium: 9.4 mg/dL (ref 8.9–10.3)
Chloride: 105 mmol/L (ref 98–111)
Creatinine, Ser: 0.74 mg/dL (ref 0.44–1.00)
GFR calc Af Amer: >60 mL/min (ref >60)
GFR calc non Af Amer: >60 mL/min (ref >60)
Glucose, Bld: 115 mg/dL — ABNORMAL HIGH (ref 70–99)
Potassium: 4.1 mmol/L (ref 3.5–5.1)
Sodium: 138 mmol/L (ref 135–145)

## 2019-10-08 LAB — TROPONIN I (HIGH SENSITIVITY)
Troponin I (High Sensitivity): <2 ng/L (ref <18)
Troponin I (High Sensitivity): <2 ng/L (ref <18)

## 2019-10-08 LAB — I-STAT BETA HCG BLOOD, ED (MC, WL, AP ONLY): I-stat hCG, quantitative: <5 m[IU]/mL (ref <5)

## 2019-10-08 MED ORDER — LACTATED RINGERS IV BOLUS
1000.0000 mL | Freq: Once | INTRAVENOUS | Status: AC
  Administered 2019-10-08: 1000 mL via INTRAVENOUS

## 2019-10-08 MED ORDER — LORAZEPAM 0.5 MG PO TABS
0.5000 mg | ORAL_TABLET | Freq: Once | ORAL | Status: AC
  Administered 2019-10-08: 0.5 mg via ORAL
  Filled 2019-10-08: qty 1

## 2019-10-08 MED ORDER — ALBUTEROL SULFATE HFA 108 (90 BASE) MCG/ACT IN AERS
2.0000 | INHALATION_SPRAY | Freq: Once | RESPIRATORY_TRACT | Status: AC
  Administered 2019-10-08: 2 via RESPIRATORY_TRACT
  Filled 2019-10-08: qty 6.7

## 2019-10-08 NOTE — ED Provider Notes (Signed)
Emergency Department Provider Note   I have reviewed the triage vital signs and the nursing notes.   HISTORY  Chief Complaint COVID +   HPI Charlene Perkins is a 43 y.o. female who presents emerged from today with some chest tightness.  Patient states she is diagnosed with coronavirus approximately 8 days ago after having symptoms for about 3 days.  She states that all of her symptoms improved but she has this persistent feeling of restricted breathing in her chest.  She feels if she has to cough to get something up that has not seemed to help.  She does not have any shortness of breath.  She does not have any cough otherwise.  She is any fever.  She has no other associated symptoms.  No lower extremity swelling.   No other associated or modifying symptoms.    Past Medical History:  Diagnosis Date  . Anemia   . S/P laparoscopic hysterectomy 10/14/2018    Patient Active Problem List   Diagnosis Date Noted  . 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 S/P laparoscopic hysterectomy 10/14/2018  . Post-operative state 10/14/2018    Past Surgical History:  Procedure Laterality Date  . CYSTOSCOPY N/A 10/14/2018   Procedure: CYSTOSCOPY;  Surgeon: Sherlyn Hay, DO;  Location: [facility];  Service: Gynecology;  Laterality: N/A;  . INGUINAL HERNIA REPAIR     AGE 45  . TOTAL LAPAROSCOPIC HYSTERECTOMY WITH SALPINGECTOMY Bilateral 10/14/2018   Procedure: TOTAL LAPAROSCOPIC HYSTERECTOMY WITH SALPINGECTOMY;  Surgeon: Sherlyn Hay, DO;  Location: [facility];  Service: Gynecology;  Laterality: Bilateral;  poss open  . UMBILICAL HERNIA REPAIR  2005  . WISDOM TOOTH EXTRACTION      Current Outpatient Rx  . Order #: [id number] Class: Historical Med  . Order #: [id number] Class: Historical Med  . Order #: [id number] Class: Historical Med    Allergies Patient has no known allergies.  History reviewed. No pertinent family history.  Social History Social History   Tobacco Use  . Smoking  status: Never Smoker  . Smokeless tobacco: Never Used  Substance Use Topics  . Alcohol use: Not on file    Comment: SOCIALLY I PER YEAR  . Drug use: Never    Review of Systems  All other systems negative except as documented in the HPI. All pertinent positives and negatives as reviewed in the HPI. ____________________________________________   PHYSICAL EXAM:  VITAL SIGNS: ED Triage Vitals  Enc Vitals Group     BP 10/07/19 2345 (!) 147/92     Pulse Rate 10/07/19 2345 (!) 129     Resp 10/07/19 2345 (!) 22     Temp 10/07/19 2345 98.9 F (37.2 C)     Temp Source 10/07/19 2345 Oral     SpO2 10/07/19 2345 100 %     Weight --      Height --      Head Circumference --      Peak Flow --      Pain Score 10/08/19 0026 0     Pain Loc --      Pain Edu? --      Excl. in [place]? --     Constitutional: Alert and oriented. Well appearing and in no acute distress. Eyes: Conjunctivae are normal. PERRL. EOMI. Head: Atraumatic. Nose: No congestion/rhinnorhea. Mouth/Throat: Mucous membranes are moist.  Oropharynx non-erythematous. Neck: No stridor.  No meningeal signs.   Cardiovascular: tachycardic rate, regular rhythm. Good peripheral circulation. Grossly normal heart sounds.   Respiratory: Normal respiratory effort.  No retractions. Lungs with mild diffuse wheezing. Gastrointestinal: Soft and nontender. No distention.  Musculoskeletal: No lower extremity tenderness nor edema. No gross deformities of extremities. Neurologic:  Normal speech and language. No gross focal neurologic deficits are appreciated.  Skin:  Skin is warm, dry and intact. No rash noted.  ____________________________________________   LABS (all labs ordered are listed, but only abnormal results are displayed)  Labs Reviewed  BASIC METABOLIC PANEL - Abnormal; Notable for the following components:      Result Value   Glucose, Bld 115 (*)    All other components within normal limits  CBC - Abnormal; Notable for the  following components:   WBC 14.7 (*)    All other components within normal limits  I-STAT BETA HCG BLOOD, ED ([facility], [facility], [facility] ONLY)  TROPONIN I (HIGH SENSITIVITY)  TROPONIN I (HIGH SENSITIVITY)   ____________________________________________  EKG   EKG Interpretation  Date/Time:  Friday October 07 2019 23:48:15 EDT Ventricular Rate:  112 PR Interval:    QRS Duration: 78 QT Interval:  320 QTC Calculation: 437 R Axis:   61 Text Interpretation:  Sinus tachycardia Ventricular premature complex Aberrant complex Low voltage, precordial leads Baseline wander in lead(s) V6 No old tracing to compare Confirmed by Merrily Pew [phone]) on 10/08/2019 1:36:19 AM       ____________________________________________  RADIOLOGY  Dg Chest Port 1 View  Result Date: 10/08/2019 CLINICAL DATA:  Shortness of breath and chest pain EXAM: PORTABLE CHEST 1 VIEW COMPARISON:  None. FINDINGS: The heart size and mediastinal contours are within normal limits. Both lungs are clear. The visualized skeletal structures are unremarkable. IMPRESSION: No acute cardiopulmonary process. Electronically Signed   By: Prudencio Pair M.D.   On: 10/08/2019 00:25    ____________________________________________   INITIAL IMPRESSION / ASSESSMENT AND PLAN / ED COURSE  Likely post covid bronchoconstriction. Workup without evidence of ACS. Low suspicion for PE even with tachycardia as it improved with fluids/ativan to below 100 without hypoxia or tachypnea. Will try inhalerto see if it helps.   Improved symptoms with albuterol. Improved HR. Plan for dc w/ strict return precautions.   Pertinent labs & imaging results that were available during my care of the patient were reviewed by me and considered in my medical decision making (see chart for details).  A medical screening exam was performed and I feel the patient has had an appropriate workup for their chief complaint at this time and likelihood of emergent condition existing is  low. They have been counseled on decision, discharge, follow up and which symptoms necessitate immediate return to the emergency department. They or their family verbally stated understanding and agreement with plan and discharged in stable condition.   ____________________________________________  FINAL CLINICAL IMPRESSION(S) / ED DIAGNOSES  Final diagnoses:  Chest pain     MEDICATIONS GIVEN DURING THIS VISIT:  Medications  albuterol (VENTOLIN HFA) 108 (90 Base) MCG/ACT inhaler 2 puff (has no administration in time range)  sodium chloride flush (NS) 0.9 % injection 3 mL (3 mLs Intravenous Given 10/08/19 0004)  LORazepam (ATIVAN) tablet 0.5 mg (0.5 mg Oral Given 10/08/19 0012)  lactated ringers bolus 1,000 mL (1,000 mLs Intravenous New Bag/Given 10/08/19 0013)     NEW OUTPATIENT MEDICATIONS STARTED DURING THIS VISIT:  New Prescriptions   No medications on file    Note:  This note was prepared with assistance of Dragon voice recognition software. Occasional wrong-word or sound-a-like substitutions may have occurred due to the inherent limitations of voice recognition  software.   Clent Damore, Corene Cornea, MD 10/08/19 [phone]

## 2019-10-08 NOTE — ED Notes (Signed)
Pt was verbalized discharge instructions. Pt had no further questions at this time. NAD. 

## 2019-10-08 NOTE — ED Triage Notes (Signed)
Pt is coming from home with a chief complaint of a blockage in her throat. Pt is A&O x 4 and ambulatory. Pt denies any SOB and is in no obvious distress at this time. Pt was tested for COVID on October 8 and had a positive test result.    SPO2 100%

## 2020-05-27 IMAGING — CR CHEST - 2 VIEW
2 series · 2 of 2 positions shown · non-contrast
Comparison: None.

CLINICAL DATA: Shortness of breath

EXAM:
CHEST - 2 VIEW

[w chest lat]
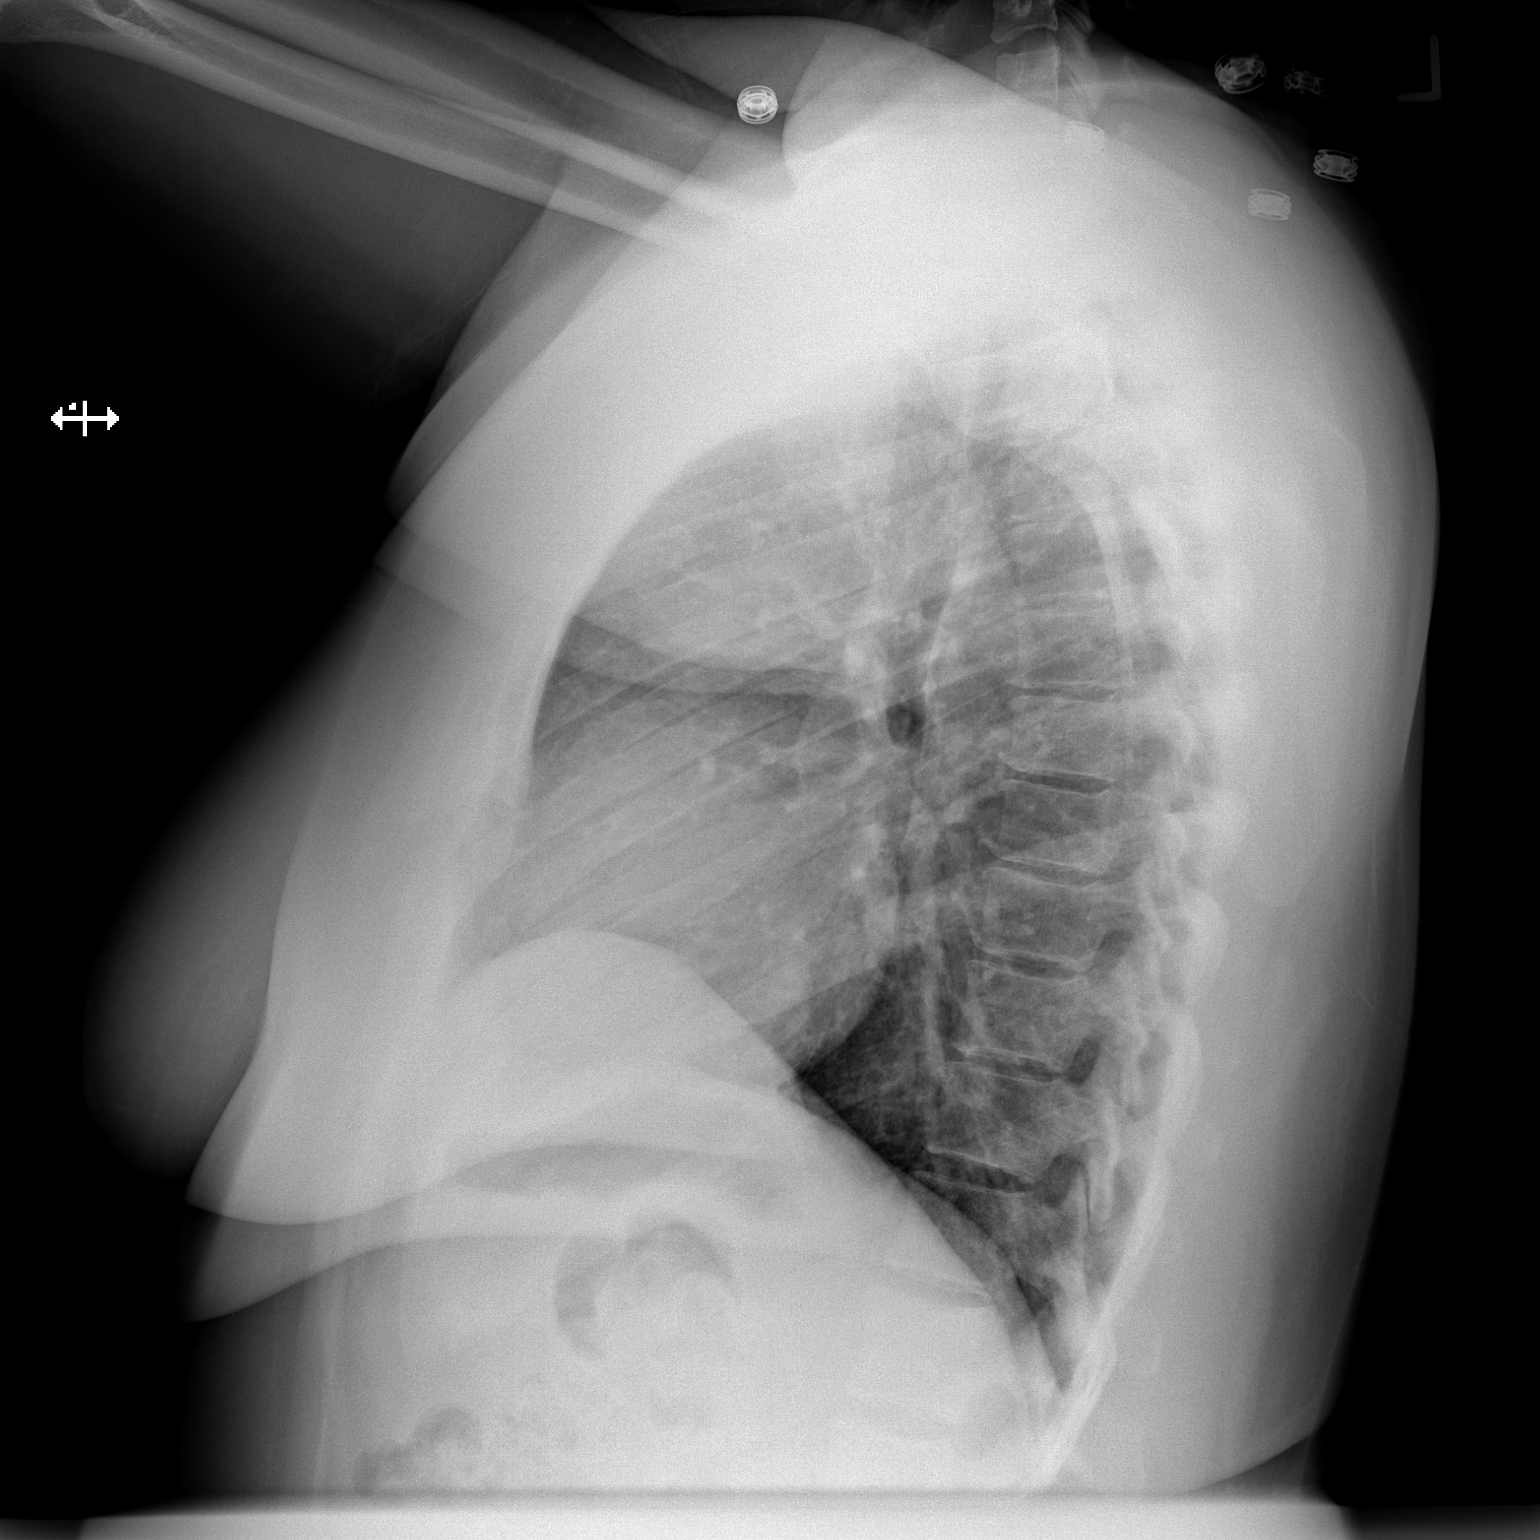

[w chest pa]
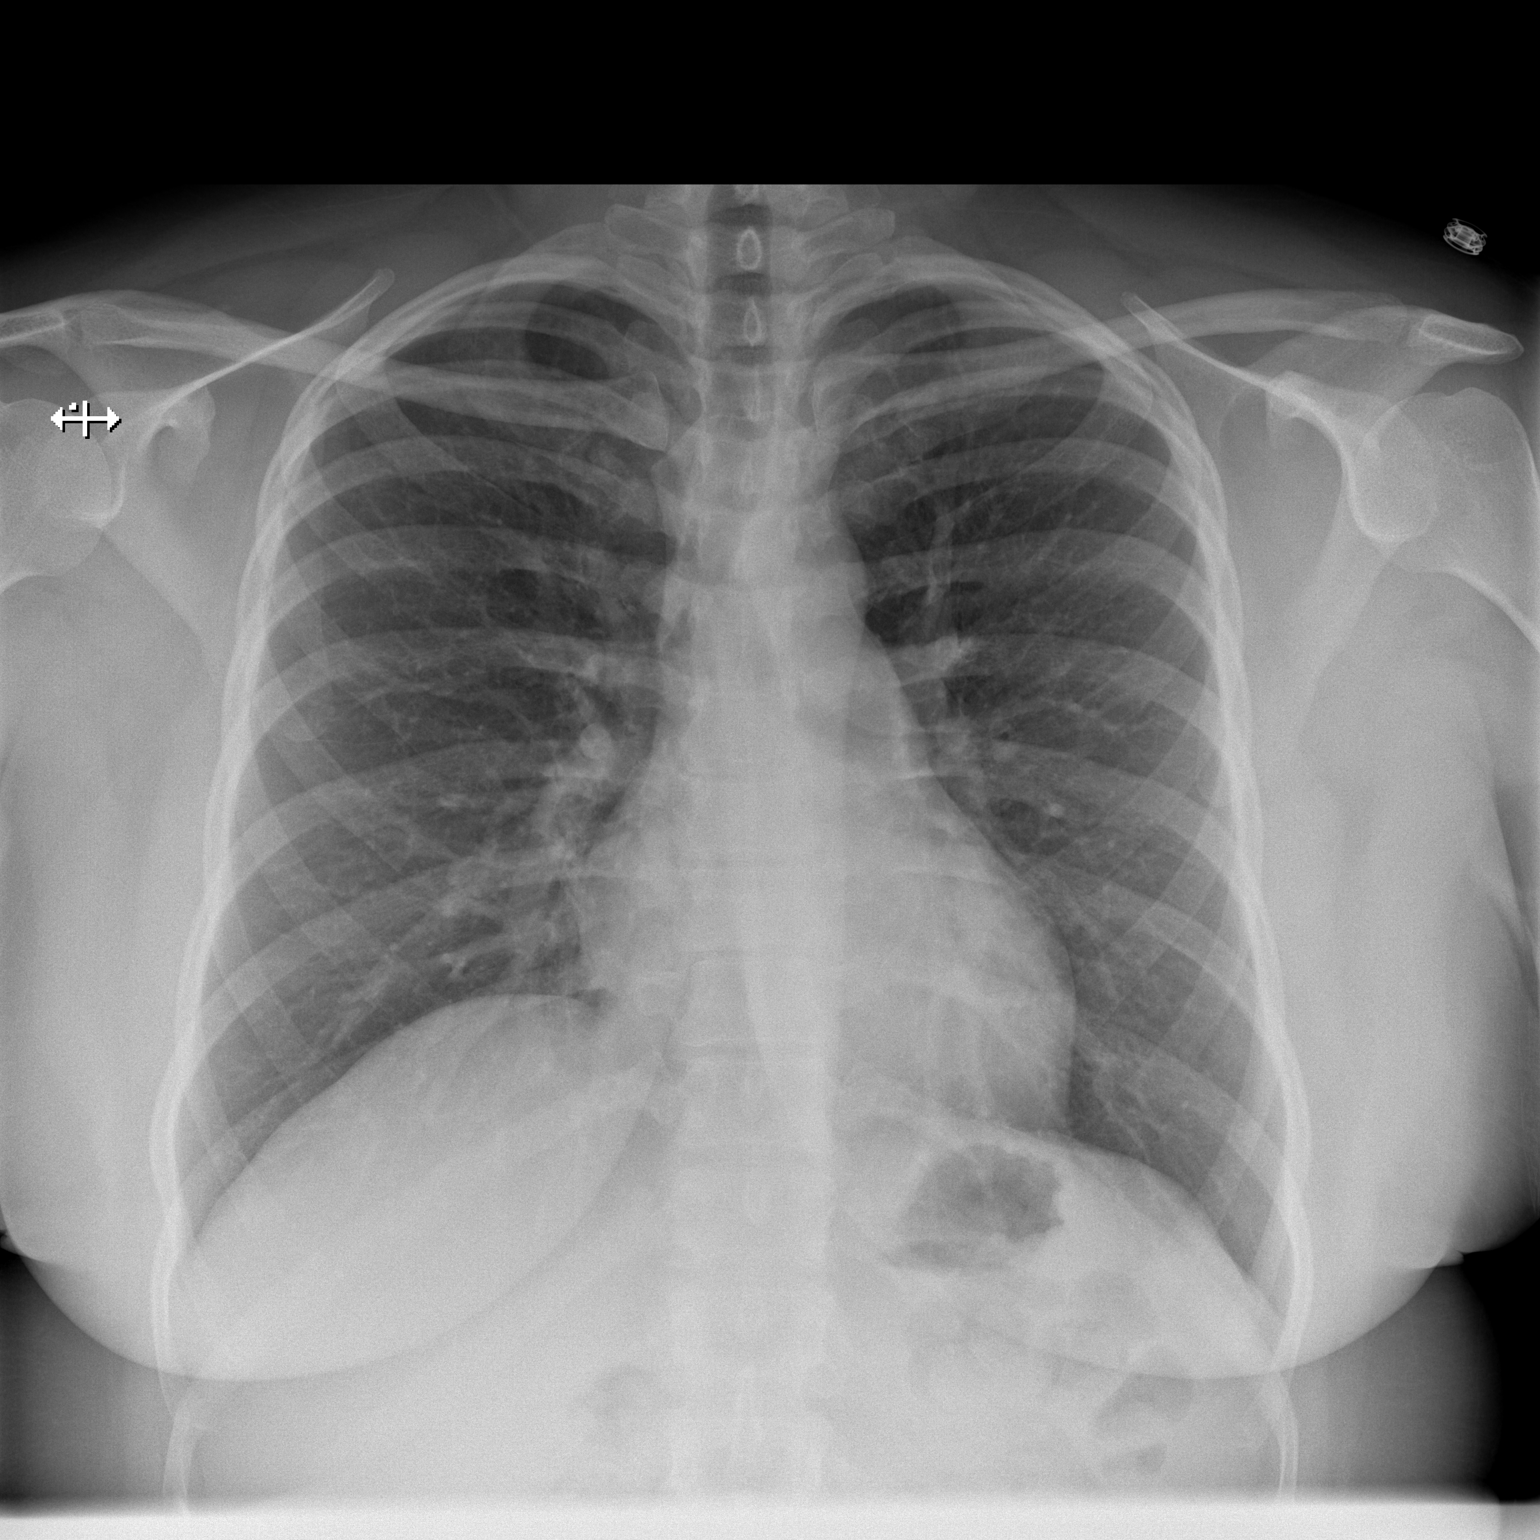

[2 of 2 positions shown; findings below may reference images not displayed]

FINDINGS: Heart and mediastinal contours are within normal limits. No focal
opacities or effusions. No acute bony abnormality.
IMPRESSION: No active cardiopulmonary disease.

## 2020-11-10 IMAGING — DX DG CHEST 1V PORT
1 series · 1 of 1 positions shown · non-contrast
Comparison: None.

CLINICAL DATA: Shortness of breath and chest pain

EXAM:
PORTABLE CHEST 1 VIEW

[chest ap]
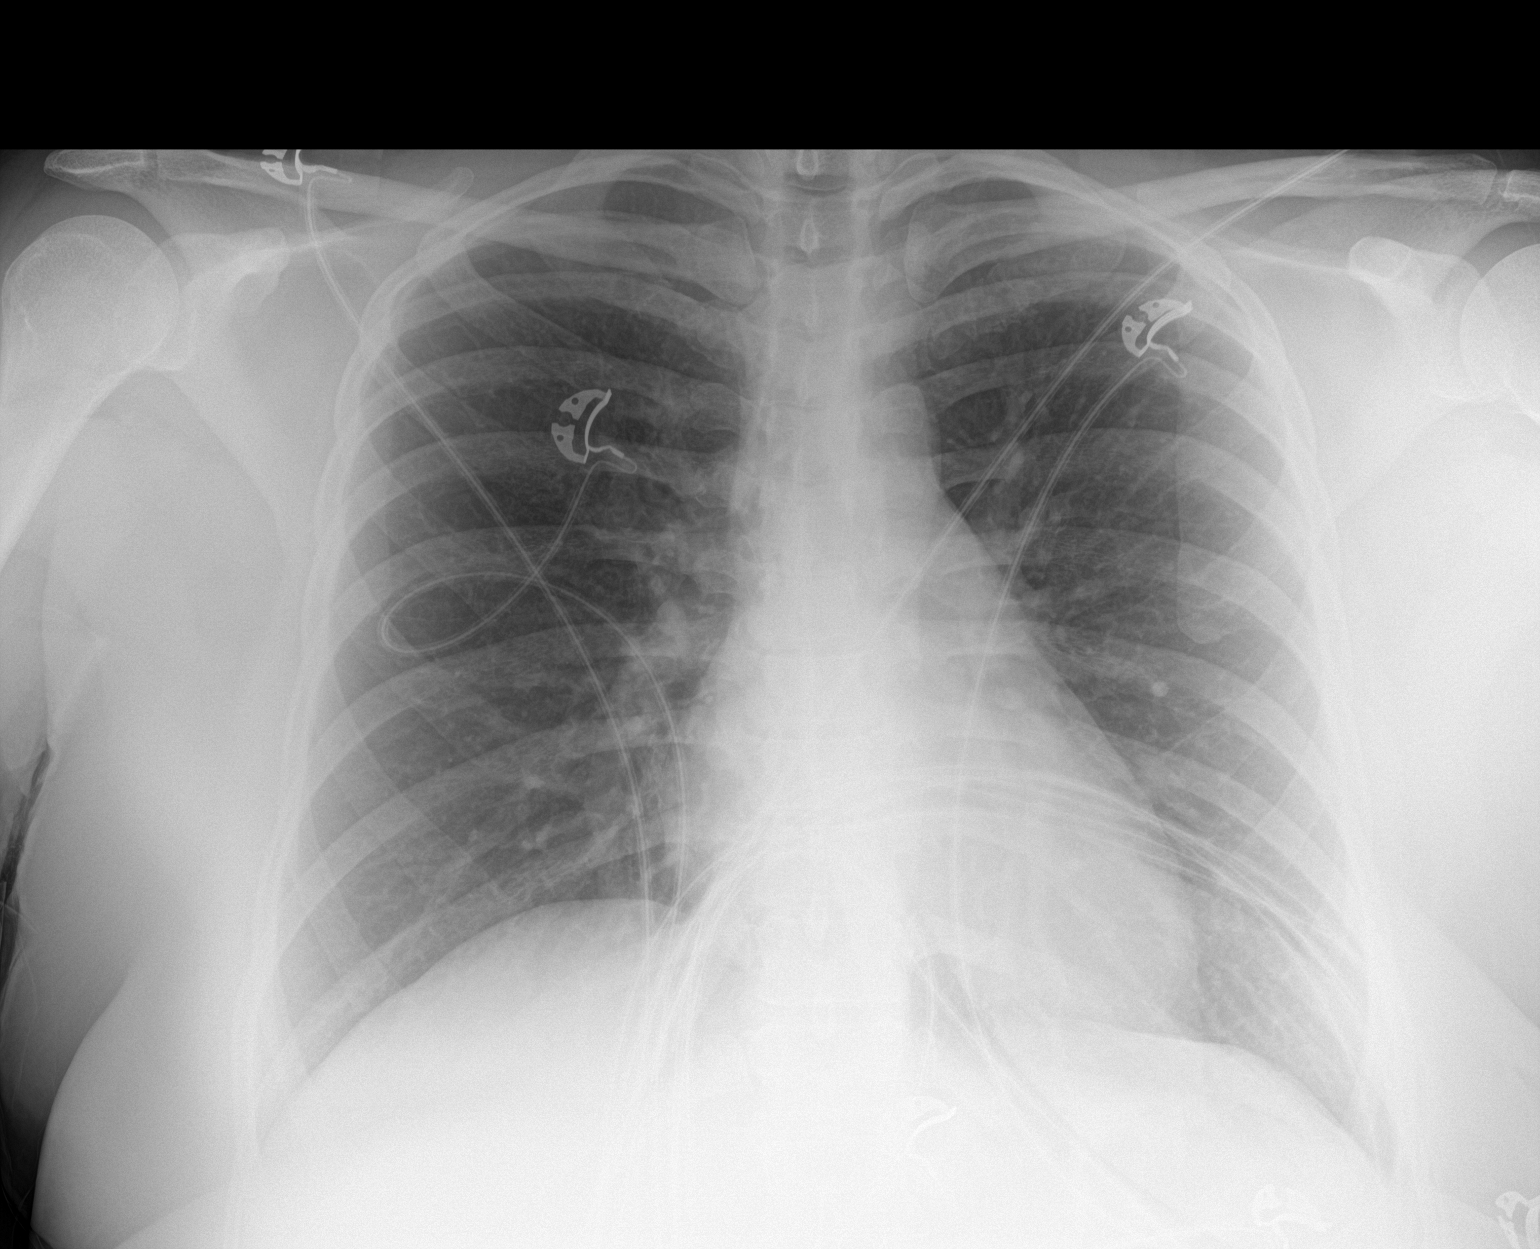

[1 of 1 positions shown; findings below may reference images not displayed]

FINDINGS: The heart size and mediastinal contours are within normal limits.
Both lungs are clear. The visualized skeletal structures are
unremarkable.
IMPRESSION: No acute cardiopulmonary process.

## 2021-02-28 ENCOUNTER — Other Ambulatory Visit: Payer: Managed Care, Other (non HMO)

## 2021-02-28 DIAGNOSIS — Z20822 Contact with and (suspected) exposure to covid-19: Secondary | ICD-10-CM

## 2021-03-01 LAB — SARS-COV-2, NAA 2 DAY TAT

## 2021-03-01 LAB — NOVEL CORONAVIRUS, NAA: SARS-CoV-2, NAA: NOT DETECTED

## 2022-11-17 ENCOUNTER — Emergency Department (HOSPITAL_COMMUNITY): Payer: Managed Care, Other (non HMO)

## 2022-11-17 ENCOUNTER — Emergency Department (HOSPITAL_COMMUNITY)
Admission: EM | Admit: 2022-11-17 | Discharge: 2022-11-18 | Disposition: A | Discharge status: Home / Self Care | Payer: Managed Care, Other (non HMO) | Attending: Emergency Medicine | Admitting: Emergency Medicine

## 2022-11-17 ENCOUNTER — Other Ambulatory Visit: Payer: Self-pay

## 2022-11-17 DIAGNOSIS — R200 Anesthesia of skin: Secondary | ICD-10-CM | POA: Diagnosis present

## 2022-11-17 DIAGNOSIS — G5602 Carpal tunnel syndrome, left upper limb: Secondary | ICD-10-CM | POA: Diagnosis not present

## 2022-11-17 NOTE — ED Provider Triage Note (Signed)
Emergency Medicine Provider Triage Evaluation Note  Charlene Perkins , a 46 y.o. female  was evaluated in triage.  Pt complains of left hand numbness and chest pressure.  She reports she is having intermittent left hand numbness for a few months.  States it is typically when she lays down to sleep.  Typically last for approximately 1 minute at a time and fully resolves.  States it lasted for 4 minutes today before resolving which caused her to feel very anxious.  She reports just prior to symptom onset she developed chest tightness as well.  Denies shortness of breath  Review of Systems  Positive: As above Negative: As above  Physical Exam  BP (!) 149/82 (BP Location: Right Arm)   Pulse 93   Temp 99.3 F (37.4 C) (Oral)   Resp 18   Ht '5\' 4"'$  (1.626 m)   Wt 115.7 kg   SpO2 100%   BMI 43.77 kg/m  Gen:   Awake, no distress   Resp:  Normal effort  MSK:   Moves extremities without difficulty  Other:  Tinel's negative.  Phalen's positive on the left side  Medical Decision Making  Medically screening exam initiated at 10:14 PM.  Appropriate orders placed.  Deshonda Cryderman was informed that the remainder of the evaluation will be completed by another provider, this initial triage assessment does not replace that evaluation, and the importance of remaining in the ED until their evaluation is complete.     Roylene Reason, [place] 11/17/22 2216

## 2022-11-17 NOTE — ED Triage Notes (Signed)
Patient coming to ED for evaluation of numbness to L arm.  Patient reports "I have been having numbness in my left for several months.  It mostly happens at night.  Today my whole arm went numbness and I couldn't shake it off.  I got really short of breath and I started having a migraine.  Now I just feel like crap."  Numbness in arm has improvement and will intermittently increase

## 2022-11-18 ENCOUNTER — Emergency Department (HOSPITAL_COMMUNITY): Payer: Managed Care, Other (non HMO)

## 2022-11-18 LAB — CBC
HCT: 39.5 % (ref 36.0–46.0)
Hemoglobin: 12.9 g/dL (ref 12.0–15.0)
MCH: 28.8 pg (ref 26.0–34.0)
MCHC: 32.7 g/dL (ref 30.0–36.0)
MCV: 88.2 fL (ref 80.0–100.0)
Platelets: 330 10*3/uL (ref 150–400)
RBC: 4.48 MIL/uL (ref 3.87–5.11)
RDW: 13.2 % (ref 11.5–15.5)
WBC: 9.7 10*3/uL (ref 4.0–10.5)
nRBC: 0 % (ref 0.0–0.2)

## 2022-11-18 LAB — BASIC METABOLIC PANEL
Anion gap: 10 (ref 5–15)
BUN: 16 mg/dL (ref 6–20)
CO2: 22 mmol/L (ref 22–32)
Calcium: 8.9 mg/dL (ref 8.9–10.3)
Chloride: 107 mmol/L (ref 98–111)
Creatinine, Ser: 0.62 mg/dL (ref 0.44–1.00)
GFR, Estimated: >60 mL/min (ref >60)
Glucose, Bld: 105 mg/dL — ABNORMAL HIGH (ref 70–99)
Potassium: 3.5 mmol/L (ref 3.5–5.1)
Sodium: 139 mmol/L (ref 135–145)

## 2022-11-18 MED ORDER — ACETAMINOPHEN 500 MG PO TABS
1000.0000 mg | ORAL_TABLET | Freq: Once | ORAL | Status: AC
  Administered 2022-11-18: 1000 mg via ORAL
  Filled 2022-11-18: qty 2

## 2022-11-18 NOTE — ED Notes (Signed)
Splint placed. Pt ambulated from ed with steady gait. Pt verbalized understanding to discharge instructions

## 2022-11-18 NOTE — ED Provider Notes (Signed)
[facility] Emergency Department Provider Note MRN:  [id number]  Arrival date & time: 11/18/22     Chief Complaint   Numbness   History of Present Illness   Charlene Perkins is a 46 y.o. year-old female presents to the ED with chief complaint of left arm numbness and tingling.  Has hx of the same.  Has had intermittent symptoms for the past 5 months or so.  States that the numbness significantly worsened this afternoon.  She denies injury.  Marland Kitchen  History provided by patient.   Review of Systems  Pertinent positive and negative review of systems noted in HPI.    Physical Exam   Vitals:   11/18/22 0411 11/18/22 0500  BP:  121/85  Pulse: 98 95  Resp:  18  Temp:    SpO2: 99% 98%    CONSTITUTIONAL:  non toxic-appearing, NAD NEURO:  Alert and oriented x 3, CN 3-12 grossly intact EYES:  eyes equal and reactive ENT/NECK:  Supple, no stridor  CARDIO:  normal rate, regular rhythm, appears well-perfused  PULM:  No respiratory distress, CTAB GI/GU:  non-distended, no focal tenderness, high BMI MSK/SPINE:  No gross deformities, no edema, moves all extremities, positive tinnel, positive phalen, normal ROM and strength of left upper extremity SKIN:  no rash, atraumatic   *Additional and/or pertinent findings included in MDM below  Diagnostic and Interventional Summary     Labs Reviewed  BASIC METABOLIC PANEL - Abnormal; Notable for the following components:      Result Value   Glucose, Bld 105 (*)    All other components within normal limits  CBC    CT HEAD WO CONTRAST (5MM)  Final Result    CT Cervical Spine Wo Contrast  Final Result    DG Chest 2 View  Final Result      Medications  acetaminophen (TYLENOL) tablet 1,000 mg (1,000 mg Oral Given 11/18/22 0518)     Procedures  /  Critical Care Procedures  ED Course and Medical Decision Making  I have reviewed the triage vital signs, the nursing notes, and pertinent available records from the  EMR.  Social Determinants Affecting Complexity of Care: Patient has no clinically significant social determinants affecting this chief complaint..   ED Course:    Medical Decision Making Patient here with left arm numbness.  It extends from the hand and wrist to the forearm.  Worsened today.  Has had had numbness intermittently for months.    CT head negative for stroke.  No significant pathology on c-spine CT.  Symptoms seem most consistent with carpal tunnel syndrome.  Amount and/or Complexity of Data Reviewed Labs: ordered.    Details: No significant electrolyte derangements that might account for paresthesias. Radiology: ordered.  Risk OTC drugs.     Consultants: No consultations were needed in caring for this patient.   Treatment and Plan: Emergency department workup does not suggest an emergent condition requiring admission or immediate intervention beyond  what has been performed at this time. The patient is safe for discharge and has  been instructed to return immediately for worsening symptoms, change in  symptoms or any other concerns    Final Clinical Impressions(s) / ED Diagnoses     ICD-10-CM   1. Carpal tunnel syndrome of left wrist  G56.02       ED Discharge Orders     None         Discharge Instructions Discussed with and Provided to Patient:   Discharge  Instructions   None      Montine Circle, PA-C 11/18/22 0539    Maudie Flakes, MD 11/18/22 [phone]
